# Patient Record
Sex: Male | Born: 1980 | Race: Black or African American | Hispanic: No | Marital: Single
Health system: Midwestern US, Community
[De-identification: ages and names within clinical notes are randomized; demographics above are authoritative.]

## PROBLEM LIST (undated history)

## (undated) DIAGNOSIS — J45909 Unspecified asthma, uncomplicated: Secondary | ICD-10-CM

## (undated) HISTORY — PX: CHOLECYSTECTOMY: SHX55

## (undated) MED ORDER — ONDANSETRON HCL 8 MG TAB: 8 mg | ORAL_TABLET | Freq: Three times a day (TID) | ORAL | Status: AC | PRN

## (undated) MED ORDER — HYDROCODONE-ACETAMINOPHEN 5 MG-325 MG TAB: 5-325 mg | ORAL_TABLET | ORAL | Status: AC | PRN

---

## 2000-03-15 ENCOUNTER — Emergency Department (HOSPITAL_COMMUNITY): Admission: EM | Admit: 2000-03-15 | Discharge: 2000-03-16 | Payer: Self-pay | Admitting: Emergency Medicine

## 2000-03-15 ENCOUNTER — Encounter: Payer: Self-pay | Admitting: Emergency Medicine

## 2000-11-28 HISTORY — PX: FINGER SURGERY: SHX640

## 2001-04-01 ENCOUNTER — Encounter: Payer: Self-pay | Admitting: Orthopedic Surgery

## 2001-04-01 ENCOUNTER — Inpatient Hospital Stay (HOSPITAL_COMMUNITY): Admission: EM | Admit: 2001-04-01 | Discharge: 2001-04-02 | Payer: Self-pay | Admitting: Emergency Medicine

## 2001-04-01 ENCOUNTER — Encounter: Payer: Self-pay | Admitting: Emergency Medicine

## 2002-10-13 ENCOUNTER — Emergency Department (HOSPITAL_COMMUNITY): Admission: EM | Admit: 2002-10-13 | Discharge: 2002-10-13 | Payer: Self-pay | Admitting: Emergency Medicine

## 2002-10-13 ENCOUNTER — Encounter: Payer: Self-pay | Admitting: Emergency Medicine

## 2005-11-27 ENCOUNTER — Emergency Department (HOSPITAL_COMMUNITY): Admission: EM | Admit: 2005-11-27 | Discharge: 2005-11-27 | Payer: Self-pay | Admitting: Family Medicine

## 2007-07-13 ENCOUNTER — Encounter: Admission: RE | Admit: 2007-07-13 | Discharge: 2007-07-13 | Payer: Self-pay | Admitting: Family Medicine

## 2008-08-18 IMAGING — CR DG FINGERS 2V UNILAT - NO REPORT
1 series · 3 of 3 positions shown · non-contrast
Comparison: NONE

CLINICAL DATA: Injured 01-03-07.  Pain and swelling at PIP joint 
of  index finger. 

RIGHT INDEX FINGER

[Series 1: view not recorded · 0.17mm/px · 3 of 3 slices shown]
[im 1/3]
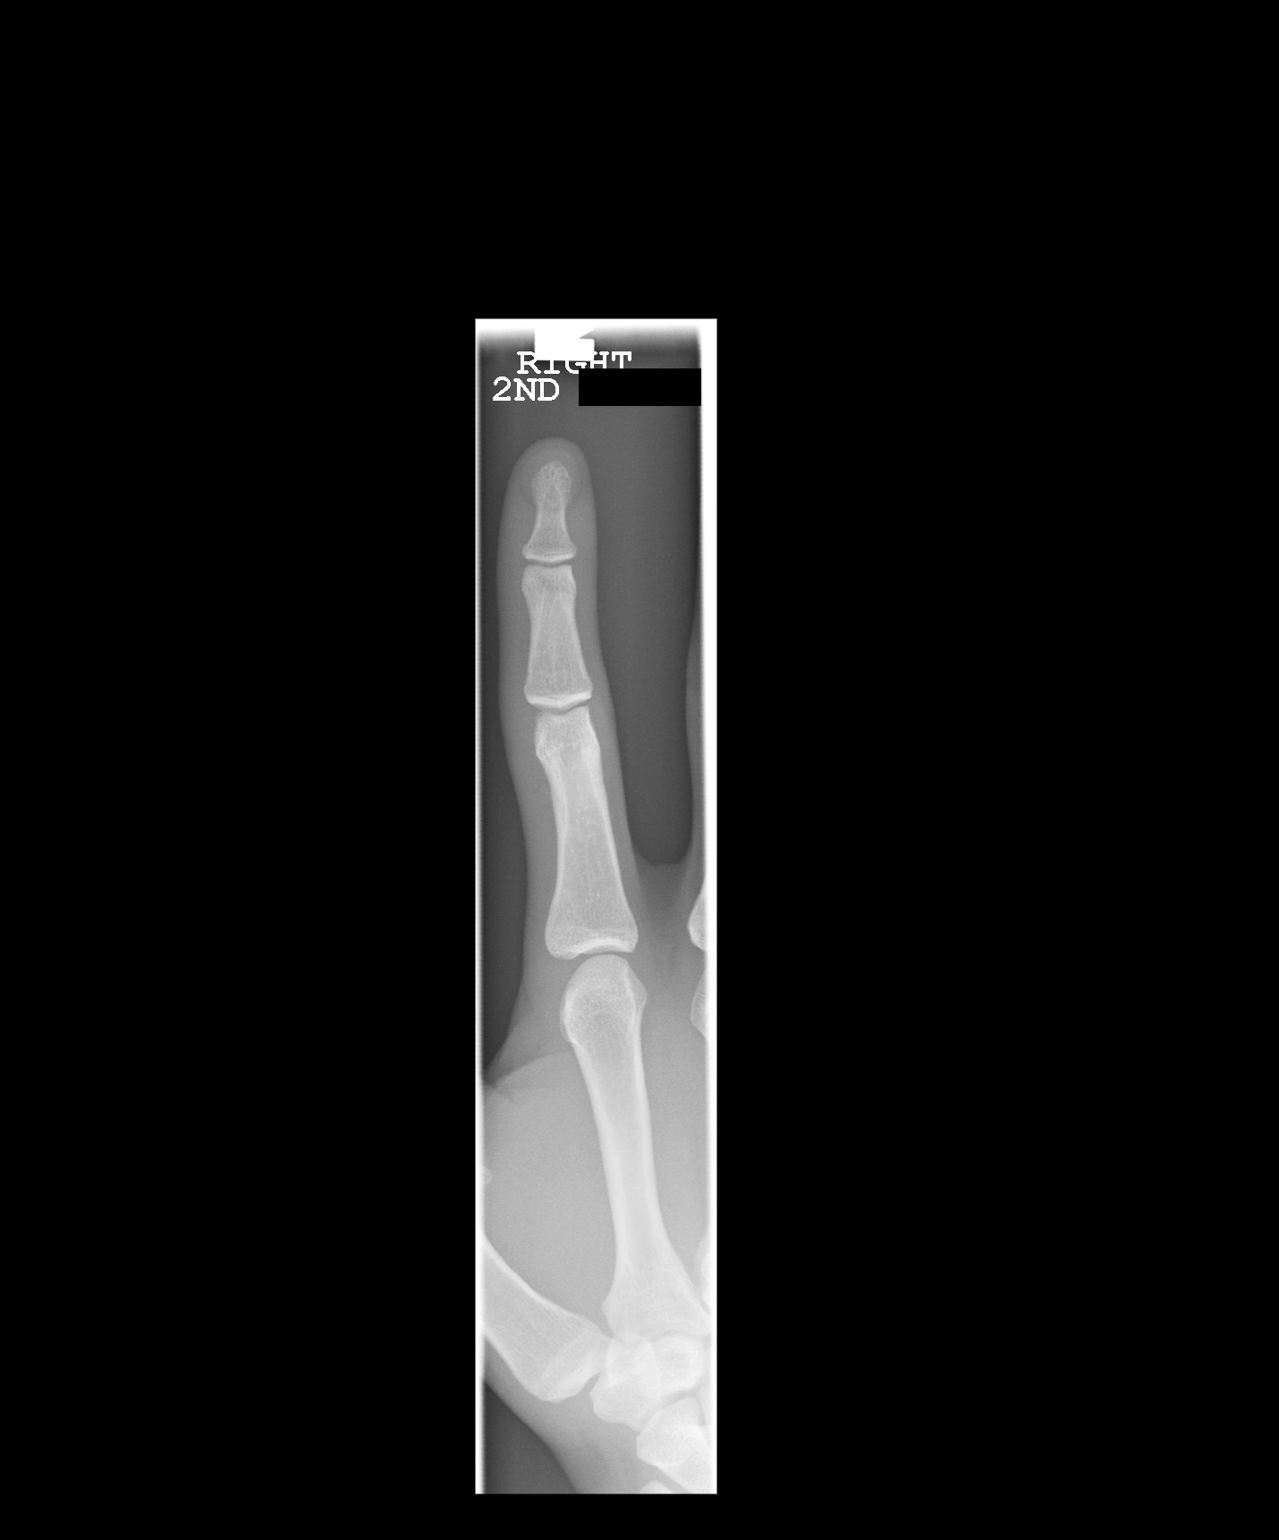
[im 2/3]
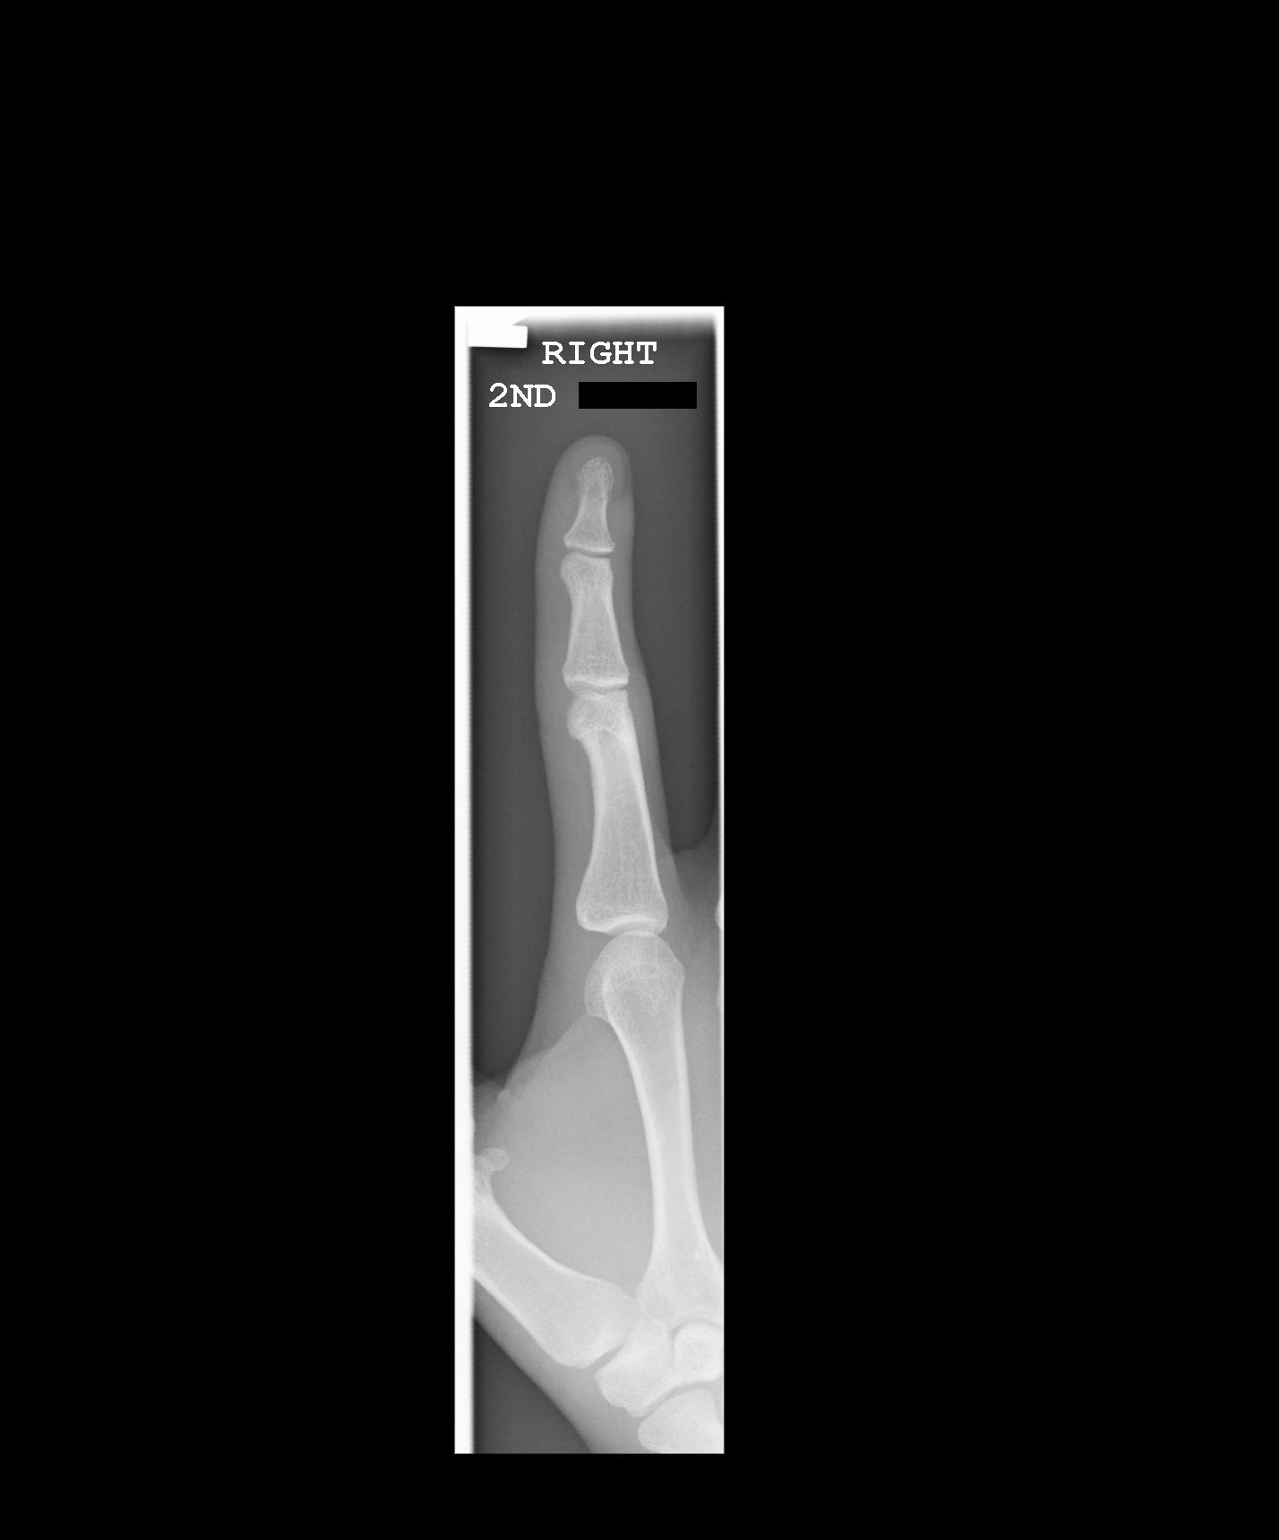
[im 3/3]
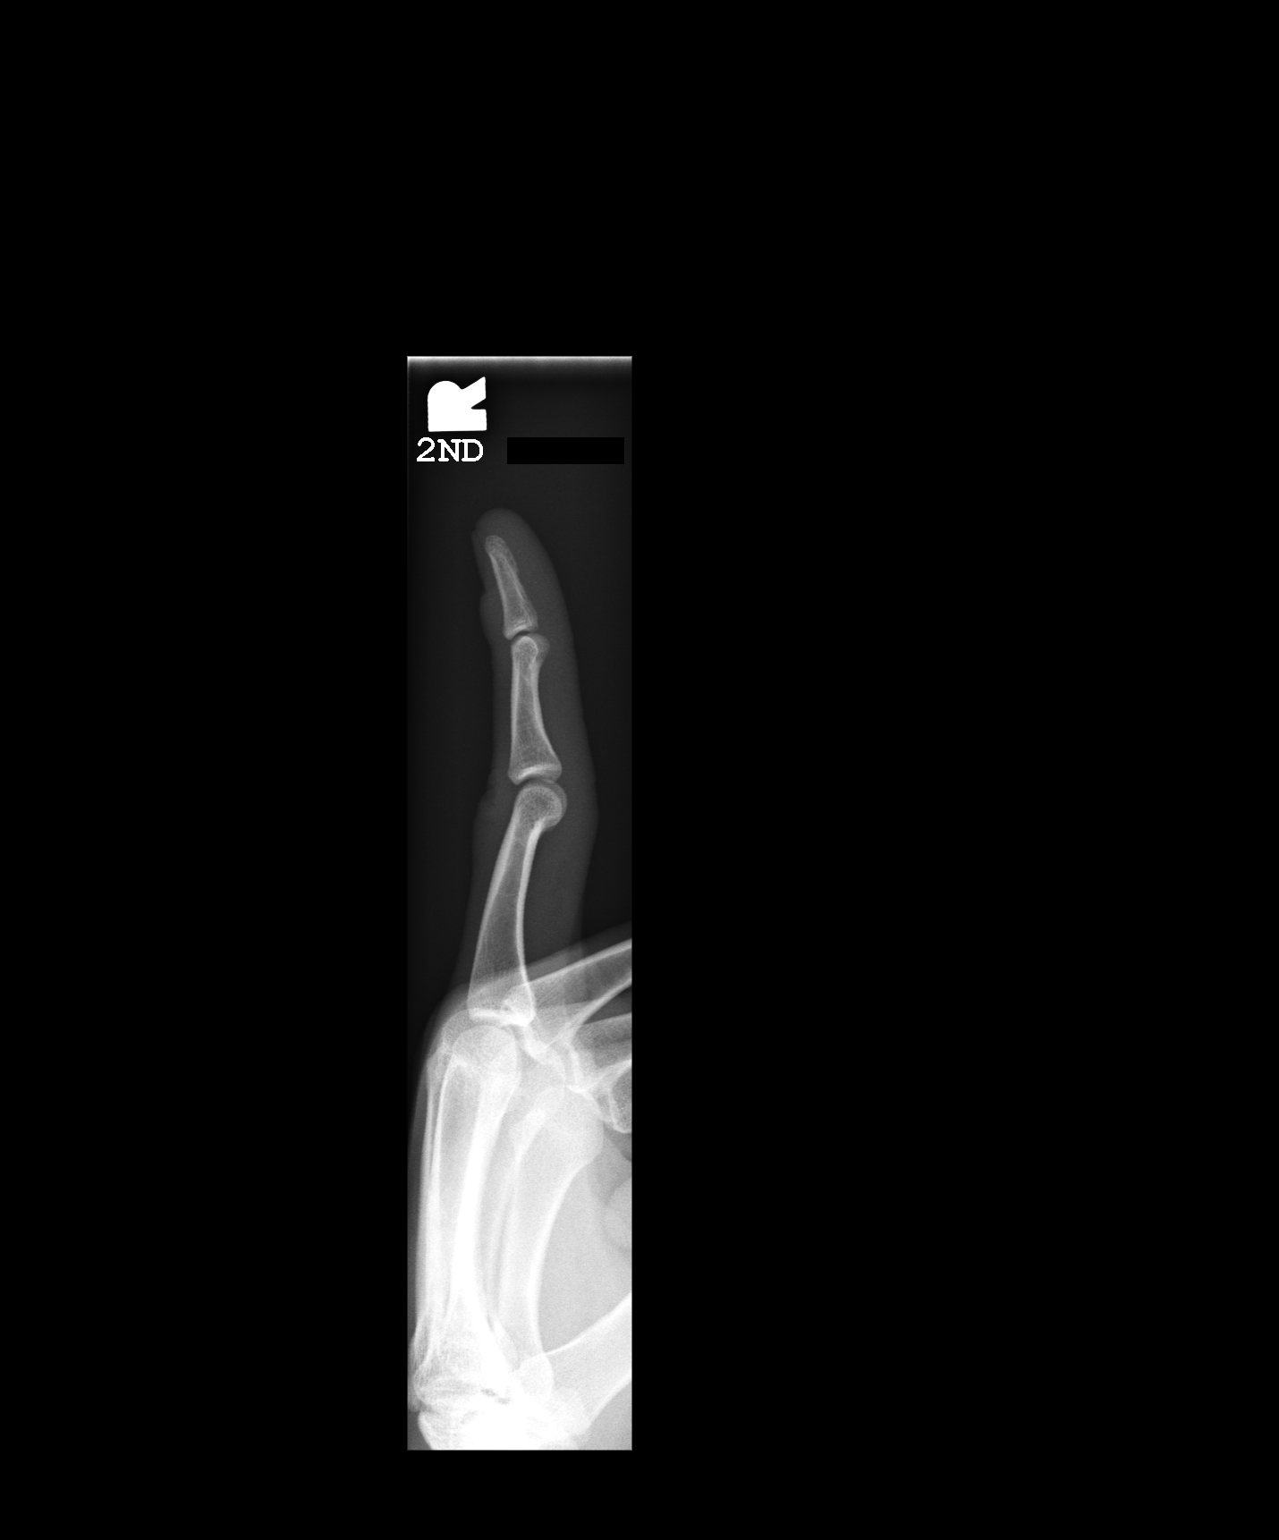

[3 of 3 positions shown; findings below may reference images not displayed]

FINDINGS: Views of the right finger demonstrate 
no evidence of fracture, dislocation, soft tissue abnormality or 
changes suggesting erosive or degenerative arthritis. 

electronically reviewed on 02/25/2007 Dict Date: 02/25/2007  Tran

## 2009-06-15 ENCOUNTER — Emergency Department (HOSPITAL_COMMUNITY): Admission: EM | Admit: 2009-06-15 | Discharge: 2009-06-15 | Payer: Self-pay | Admitting: Emergency Medicine

## 2011-04-15 NOTE — Op Note (Signed)
[facility]  Patient:    Robert Hall, Robert Hall                        MRN: [id number] Proc. Date: 04/01/01 Attending:  Aron Baba, M.D.                           Operative Report  PREOPERATIVE DIAGNOSES:  Open left small finger fracture with volar laceration 2.5 cm based volar ulnarly. Dorsal laceration as well with open hematoma of blood eggressing from this site. Comminuted nonarticular proximal phalanx fracture with severe displacement. Rule out neurovascular injury.  POSTOPERATIVE DIAGNOSES: 1. Extensor tendon laceration left small finger. 2. Open P1 (proximal phalanx fracture) left small finger. 3. Intact ulnar neurovascular bundles about the small finger and intact    flexor sheath with some hemorrhage noted in the sheath but no volar    opening.  OPERATION PERFORMED: 1. Incision and drainage skin and subcutaneous tissue bone and tendon sheath    tissue. This was excisional debridement with particulate matter secondary    to an open fracture. 2. Open reduction internal fixation open proximal phalanx fracture with    comminution. This was done with Kirschner wire fixation. 3. Extensor tendon repair, left small finger. 4. Exploration of the ulnar volar neurovascular bundles and flexor sheath    which revealed intact neurovascular bundles and intact sheath with some    blood in it from the dorsal wound. This does increase the risk for tendon    adhesions postoperatively. 5. Closure of a 2.5 - 3 cm volar ulnar laceration about the left small finger.  ANESTHESIA:  General.  ESTIMATED BLOOD LOSS:  Minimal.  TOURNIQUET TIME:  Less than 40 minutes.  SURGEON:  Dominica Severin, M.D.  ASSISTANT:  Shelbie Proctor, P.A.-C.  ANESTHESIA:  General.  DRAINS:  None.  INDICATIONS FOR PROCEDURE:  The patient is a 30 year old black male who sustained the above mentioned injury Apr 01, 2001 at approximately 7 p.m. and he was taken to [facility].  I was asked to see him by Dr. Carren Rang. Subsequent to this, I prepped the patient for surgery and discussed with him the risks and benefits of surgery including the risk of infection, bleeding, anesthesia, damage to normal structures and failure of the surgery to accomplish its intended goals of relieving symptoms and restoring function. With this in mind, he desires to proceed. I have discussed with him the risk of postoperative pain, stiffness, need for extensor and flexor tenolysis and other complications such as pin tract infection, etc. I discussed pre and postop plans with him at length and he desired to proceed.  OPERATIVE FINDINGS:  The patient had an open proximal phalanx fracture, this was opened through the dorsal laceration primarily with extensor tendon laceration which was repaired. Fixation was obtained with Eaton-Velski type technique pinning and this achieved excellent fixation. The bone ______ nicely and was stable. I do think he is a candidate for early range of motion. The patient underwent exploration of the ulnar, volar, and neurovascular bundles which were intact.  DESCRIPTION OF PROCEDURE:  The patient was seen by myself and anesthesia, taken to the operative suite. Preoperative antibiotics were given. His tetanus shot is up to date I should note. Once this was done, he was laid supine, smooth induction of general endotracheal anesthesia was induced. Following this, the patient underwent tourniquet application, appropriate padding, prepped and draped about the left  upper extremity in the usual sterile fashion with a 10 minute surgical Betadine scrub followed by Betadine solution, painting and draping. Once this was done, the arm was elevated, tourniquet was insufflated to 150 mmHg and the patient underwent an I&D of the polar ulnar structures and dorsal structures. This was an excisional debridement including skin, subcutaneous tissue and bone. The patient had  attention first placed about the ulnar volar laceration site. This underwent I&D and exploration. The skin, subcutaneous tissue and flexor sheath was I&Dd. The patient had the ulnar nerve and artery identified and these were noted to be intact. The patient had flexor sheath hemorrhaging of a mild amount but no obvious lack the volar flexor sheath. This indicated dorsal bleeding into the sheath in my opinion. This was not excessive. The neurovascular bundle and flexor sheath was explored at length. The radial neurovascular bundle was intact. This I&D was accomplished without difficulty and the wound was closed loosely with Prolene suture. Following this, dorsal I&D was accomplished with skin, subcutaneous tissue and bone. The extensor tendon was lacerated in a longitudinal and oblique fashion. The bone was easily identifiable in the wound, retractors were gently placed and an incision was made proximal to this. Removal of a 1 mm skin edge was accomplished with a scissor tip and knife blade. The fracture was identified, exposed and noted to be open through the dorsal wound. Thus I&D was accomplished with large amounts of antibiotic and normal saline. Following this, the patient had drapes changed and the patient then underwent reduction of the fracture. The fracture was reduced and held with provisional fixation followed by placement of Eaton-Velski type pinning with Kirschner of the 0.035 variety. This achieved excellent fixation and I was very pleased with this. This entered at the proximal phalanx, proximally at the MCP region without entering the MCP joint and then goes distally. This accomplished excellent fixation. I was very pleased with the fixation overall. With this noted, the patient had x-rays taken in AP, lateral and oblique which were saved for permanent documentation. Following taking these x-rays and saving them, the patient then underwent pin cap placement after the pins were  bent. The PIP and MCP had excellent range of motion and excellent bony stability was noted under fluoro and visually. Following this,  copious irrigation was applied and once again the tourniquet was deflated and the extensor tendon was repaired with 5-0 Mersilene suture in an interrupted fashion. Once this was repaired, the patient underwent a skin closure after hemostasis was secured 4-0 Prolene suture. Following this, the patient had Adaptic placed over the wound and Xeroform placed over the pins. A sterile dressing was placed as well as finger splint and volar plaster splint. The hand was placed in functional position with the IPs extended and the MCP joint flexed about the index through small fingers. The patient had excellent refill. Marcaine was placed in the wound for postop pain relief. Approximately 8 cc of 0.25% without epinephrine was placed. The patient tolerated the procedure well without difficulty. There were no immediate intraoperative complications. All sponge, needle and instrument counts were reported as correct. The patient will be admitted to the hospital for IV antibiotics, elevation, pain control, IV fluids and postop measures. I have discussed with the patients mother all findings. At the present time, the patient is stable in the recovery room. He was transferred there after being awakened from general endotracheal anesthesia. The operation went without difficulty. He did undergo extensive repair, bony repair, I&D, closure  of the volar laceration and exploration of structures as necessary as described within the context of this dictation. DD:  04/01/01 TD:  04/02/01 Job: [id number]

## 2011-11-01 ENCOUNTER — Emergency Department (HOSPITAL_BASED_OUTPATIENT_CLINIC_OR_DEPARTMENT_OTHER)
Admission: EM | Admit: 2011-11-01 | Discharge: 2011-11-01 | Disposition: A | Discharge status: Home / Self Care | Payer: 59 | Attending: Emergency Medicine | Admitting: Emergency Medicine

## 2011-11-01 ENCOUNTER — Emergency Department (INDEPENDENT_AMBULATORY_CARE_PROVIDER_SITE_OTHER): Payer: 59

## 2011-11-01 DIAGNOSIS — R109 Unspecified abdominal pain: Secondary | ICD-10-CM

## 2011-11-01 DIAGNOSIS — R110 Nausea: Secondary | ICD-10-CM | POA: Insufficient documentation

## 2011-11-01 DIAGNOSIS — M439 Deforming dorsopathy, unspecified: Secondary | ICD-10-CM

## 2011-11-01 LAB — URINALYSIS, ROUTINE W REFLEX MICROSCOPIC
Bilirubin Urine: NEGATIVE
Leukocytes, UA: NEGATIVE
Nitrite: NEGATIVE
Specific Gravity, Urine: 1.023 (ref 1.005–1.030)
Urobilinogen, UA: 0.2 mg/dL (ref 0.0–1.0)

## 2011-11-01 MED ORDER — SODIUM CHLORIDE 0.9 % IV BOLUS (SEPSIS)
1000.0000 mL | Freq: Once | INTRAVENOUS | Status: AC
  Administered 2011-11-01: 1000 mL via INTRAVENOUS

## 2011-11-01 MED ORDER — HYDROMORPHONE HCL PF 1 MG/ML IJ SOLN
1.0000 mg | Freq: Once | INTRAMUSCULAR | Status: AC
  Administered 2011-11-01: 1 mg via INTRAVENOUS
  Filled 2011-11-01: qty 1

## 2011-11-01 MED ORDER — ONDANSETRON HCL 4 MG PO TABS: 8.0000 mg | ORAL_TABLET | Freq: Four times a day (QID) | ORAL | Status: AC

## 2011-11-01 MED ORDER — HYDROCODONE-ACETAMINOPHEN 5-500 MG PO TABS: 1.0000 | ORAL_TABLET | Freq: Four times a day (QID) | ORAL | Status: AC | PRN

## 2011-11-01 MED ORDER — ONDANSETRON HCL 4 MG/2ML IJ SOLN
4.0000 mg | Freq: Once | INTRAMUSCULAR | Status: AC
  Administered 2011-11-01: 4 mg via INTRAVENOUS
  Filled 2011-11-01: qty 2

## 2011-11-01 NOTE — ED Notes (Signed)
Patient transported to CT 

## 2011-11-01 NOTE — ED Provider Notes (Signed)
History     CSN: [id number] Arrival date & time: 11/01/2011  9:33 AM   First MD Initiated Contact with Patient 11/01/11 [phone]      Chief Complaint  Patient presents with  . Abdominal Pain  . Nausea    (Consider location/radiation/quality/duration/timing/severity/associated sxs/prior treatment) Patient is a 30 y.o. male presenting with abdominal pain. The history is provided by the patient.  Abdominal Pain The primary symptoms of the illness include abdominal pain.   the patient reports developing acute onset sharp right sided abdominal pain with radiation up to his right flank.  He reported this as colicky in nature.  He's been nauseated.  He has not vomited.  He denies diarrhea.  Denies melena and hematochezia.  He has no prior history kidney stones.  He reports he awoke this morning and felt normal.  He denies fever and chills.  His symptoms are mild to moderate in severity.  There waxing and waning.  Nothing appears to improve his symptoms.  Nothing worsens the symptoms.  History reviewed. No pertinent past medical history.  History reviewed. No pertinent past surgical history.  No family history on file.  History  Substance Use Topics  . Smoking status: Never Smoker   . Smokeless tobacco: Never Used  . Alcohol Use: Yes     occasional      Review of Systems  Gastrointestinal: Positive for abdominal pain.  All other systems reviewed and are negative.    Allergies  Review of patient's allergies indicates no known allergies.  Home Medications  No current outpatient prescriptions on file.  BP 124/66  Pulse 57  Temp(Src) 98 F (36.7 C) (Oral)  Resp 16  Ht 5\' 11"  (1.803 m)  Wt 158 lb (71.668 kg)  BMI 22.04 kg/m2  SpO2 100%  Physical Exam  Nursing note and vitals reviewed. Constitutional: He is oriented to person, place, and time. He appears well-developed and well-nourished.  HENT:  Head: Normocephalic and atraumatic.  Eyes: EOM are normal.  Neck: Normal  range of motion.  Cardiovascular: Normal rate, regular rhythm, normal heart sounds and intact distal pulses.   Pulmonary/Chest: Effort normal and breath sounds normal. No respiratory distress.  Abdominal: Soft. He exhibits no distension and no mass. There is no rebound and no guarding.       Mild generalized right-sided abdominal tenderness without guarding or rebound  Genitourinary:       No CVA  Musculoskeletal: Normal range of motion.  Neurological: He is alert and oriented to person, place, and time.  Skin: Skin is warm and dry.  Psychiatric: He has a normal mood and affect. Judgment normal.    ED Course  Procedures (including critical care time)   Labs Reviewed  URINALYSIS, ROUTINE W REFLEX MICROSCOPIC   Ct Abdomen Pelvis Wo Contrast  11/01/2011  *RADIOLOGY REPORT*  Clinical Data: Right-sided flank pain.  Question ureteral stone.  CT ABDOMEN AND PELVIS WITHOUT CONTRAST  Technique:  Multidetector CT imaging of the abdomen and pelvis was performed following the standard protocol without intravenous contrast.  Comparison: Single view abdomen 07/13/2007.  Findings: Mild dependent atelectasis is present at the left lung base.  The lungs are otherwise clear without other focal nodule, mass, or airspace disease.  Heart size is normal.  No significant pleural or pericardial effusion is present.  The liver and spleen are within normal limits.  The stomach, duodenum, and pancreas are within normal limits.  The common bile duct and gallbladder are normal.  The adrenal glands are  normal bilaterally.  No significant nephrolithiasis is evident.  The ureters are within normal limits bilaterally to the level of the urinary bladder.  The rectosigmoid colon is within normal limits.  The remainder of the colon is unremarkable.  The appendix is visualized and normal. There are air-fluid levels within non dilated proximal loops of small bowel.  This is likely within normal limits.  A partial ileus is not  excluded.  There is no evidence for obstruction or free air. No significant adenopathy or free fluid is present.  The urinary bladder is within normal limits.  The bone windows demonstrate mild leftward curvature of the lumbar spine, centered at L2-3.  IMPRESSION:  1.  Mildly dilated loops of small bowel with air-fluid levels. This may be within normal limits.  A mild ileus is also considered 2.  No evidence for nephrolithiasis or hydronephrosis. 3.  Slight curvature of the lumbar spine.  Original Report Authenticated By: Jamesetta Orleans. MATTERN, M.D.   I personally reviewed the patient's CT scan.  1. Abdominal pain       MDM  Patient's presenting symptoms and complaints were concerning for right-sided ureterolithiasis.  CT scan is negative for ureterolithiasis.  There is comment of some small dilated loops of bowel which may reviewed possible ileus.  The patient feels much better at this time.  Repeat abdominal exam is benign.  My suspicion for early appendicitis is very low.  Patient was instructed to return back to the ER for severe nausea vomiting or development of fever greater than 101 or worsening abdominal pain.  The patient understands this and all of his questions were answered        Lyanne Co, MD 11/01/11 1217

## 2011-11-01 NOTE — ED Notes (Signed)
Pt reports he developed sharp abdominal pain and nausea this am.

## 2013-04-13 LAB — CBC WITH AUTOMATED DIFF
ABS. BASOPHILS: 0 10*3/uL (ref 0.0–0.2)
ABS. EOSINOPHILS: 0 10*3/uL (ref 0.0–0.8)
ABS. IMM. GRANS.: 0 10*3/uL (ref 0.0–0.5)
ABS. LYMPHOCYTES: 1 10*3/uL (ref 0.5–4.6)
ABS. MONOCYTES: 0.8 10*3/uL (ref 0.1–1.3)
ABS. NEUTROPHILS: 9.1 10*3/uL — ABNORMAL HIGH (ref 1.7–8.2)
BASOPHILS: 0 % (ref 0.0–2.0)
EOSINOPHILS: 0 % — ABNORMAL LOW (ref 0.5–7.8)
HCT: 45.1 % (ref 41.1–50.3)
HGB: 15.7 g/dL (ref 13.6–17.2)
IMMATURE GRANULOCYTES: 0.3 % (ref 0.0–5.0)
LYMPHOCYTES: 9 % — ABNORMAL LOW (ref 13–44)
MCH: 27.7 PG (ref 26.1–32.9)
MCHC: 34.8 g/dL (ref 31.4–35.0)
MCV: 79.7 FL (ref 79.6–97.8)
MONOCYTES: 7 % (ref 4.0–12.0)
MPV: 10.7 FL — ABNORMAL LOW (ref 10.8–14.1)
NEUTROPHILS: 84 % — ABNORMAL HIGH (ref 43–78)
PLATELET: 190 10*3/uL (ref 150–450)
RBC: 5.66 M/uL (ref 4.23–5.67)
RDW: 13.7 % (ref 11.9–14.6)
WBC: 11 10*3/uL (ref 4.3–11.1)

## 2013-04-13 LAB — METABOLIC PANEL, COMPREHENSIVE
A-G Ratio: 1 — ABNORMAL LOW (ref 1.2–3.5)
ALT (SGPT): 34 U/L (ref 12–65)
AST (SGOT): 28 U/L (ref 15–37)
Albumin: 4.2 g/dL (ref 3.5–5.0)
Alk. phosphatase: 79 U/L (ref 50–136)
Anion gap: 8 mmol/L (ref 7–16)
BUN: 12 MG/DL (ref 6–23)
Bilirubin, total: 1.1 MG/DL (ref 0.2–1.1)
CO2: 30 mmol/L (ref 21–32)
Calcium: 9.7 MG/DL (ref 8.3–10.4)
Chloride: 105 mmol/L (ref 98–107)
Creatinine: 1 MG/DL (ref 0.8–1.5)
GFR est AA: >60 mL/min/{1.73_m2} (ref >60)
GFR est non-AA: >60 mL/min/{1.73_m2} (ref >60)
Globulin: 4.3 g/dL — ABNORMAL HIGH (ref 2.3–3.5)
Glucose: 89 mg/dL (ref 65–100)
Potassium: 3.9 mmol/L (ref 3.5–5.1)
Protein, total: 8.5 g/dL — ABNORMAL HIGH (ref 6.3–8.2)
Sodium: 143 mmol/L (ref 136–145)

## 2013-04-13 MED ADMIN — sodium chloride 0.9 % bolus infusion 1,000 mL: INTRAVENOUS | @ 19:00:00 | NDC 00409798309

## 2013-04-13 MED ADMIN — ketorolac (TORADOL) injection 30 mg: INTRAVENOUS | @ 19:00:00 | NDC 00409379501

## 2013-04-13 MED ADMIN — ondansetron (ZOFRAN) injection 4 mg: INTRAVENOUS | @ 19:00:00 | NDC 00409475503

## 2013-04-13 MED FILL — KETOROLAC TROMETHAMINE 30 MG/ML INJECTION: 30 mg/mL (1 mL) | INTRAMUSCULAR | Qty: 1

## 2013-04-13 MED FILL — ONDANSETRON (PF) 4 MG/2 ML INJECTION: 4 mg/2 mL | INTRAMUSCULAR | Qty: 2

## 2013-04-13 NOTE — ED Notes (Signed)
Patient in CT.

## 2013-04-13 NOTE — ED Notes (Signed)
I have reviewed discharge instructions with the patient and spouse.  The patient and spouse verbalized understanding.  Patient signed hard copy discharge instructions.

## 2013-04-13 NOTE — ED Notes (Signed)
Patient given Ginger Ale to sip per request.

## 2013-04-13 NOTE — ED Notes (Signed)
Patient reports feeling "much better".  IV fluids completed.  Pain decreased 3/10.   Patient tolerating po fluids without nausea or vomiting.

## 2013-04-13 NOTE — ED Notes (Signed)
Patient states he started having left flank and abdominal pain last night, states he then became dizzy and started vomiting.

## 2013-04-13 NOTE — ED Provider Notes (Signed)
Patient is a 32 y.o. male presenting with vomiting. The history is provided by the patient.   Vomiting   This is a new problem. The current episode started 12 to 24 hours ago. The problem has been gradually worsening. The emesis has an appearance of stomach contents. There has been no fever. Associated symptoms include abdominal pain. Pertinent negatives include no chills, no fever, no sweats, no diarrhea, no arthralgias, no myalgias and no cough. His pertinent negatives include no recent abdominal surgery and no DM.        History reviewed. No pertinent past medical history.     History reviewed. No pertinent past surgical history.      History reviewed. No pertinent family history.     History     Social History   ??? Marital Status: SINGLE     Spouse Name: N/A     Number of Children: N/A   ??? Years of Education: N/A     Occupational History   ??? Not on file.     Social History Main Topics   ??? Smoking status: Never Smoker    ??? Smokeless tobacco: Never Used   ??? Alcohol Use: No   ??? Drug Use: No   ??? Sexually Active: Not on file     Other Topics Concern   ??? Not on file     Social History Narrative   ??? No narrative on file                  ALLERGIES: Review of patient's allergies indicates no known allergies.      Review of Systems   Constitutional: Negative.  Negative for fever, chills and activity change.   HENT: Negative.    Eyes: Negative.    Respiratory: Negative.  Negative for cough.    Cardiovascular: Negative.    Gastrointestinal: Positive for vomiting and abdominal pain. Negative for diarrhea.   Genitourinary: Negative.    Musculoskeletal: Negative.  Negative for myalgias and arthralgias.   Skin: Negative.    Neurological: Negative.    Psychiatric/Behavioral: Negative.    All other systems reviewed and are negative.        Filed Vitals:    04/13/13 1348   BP: 144/93   Pulse: 64   Temp: 98 ??F (36.7 ??C)   Resp: 19   Height: 5\' 6"  (1.676 m)   Weight: 81.647 kg (180 lb)   SpO2: 99%            Physical Exam   Nursing  note and vitals reviewed.  Constitutional: He is oriented to person, place, and time. He appears well-developed and well-nourished.   HENT:   Head: Normocephalic and atraumatic.   Right Ear: External ear normal.   Left Ear: External ear normal.   Eyes: Conjunctivae and EOM are normal. Pupils are equal, round, and reactive to light.   Neck: Normal range of motion. Neck supple.   Cardiovascular: Normal rate, regular rhythm and intact distal pulses.    Pulmonary/Chest: Effort normal and breath sounds normal.   Abdominal: Soft. Bowel sounds are normal. He exhibits no distension. There is no tenderness. There is no rebound and no guarding.   Musculoskeletal: Normal range of motion.   Neurological: He is alert and oriented to person, place, and time. No cranial nerve deficit.   Skin: Skin is warm and dry.   Psychiatric: He has a normal mood and affect.        MDM     Amount  and/or Complexity of Data Reviewed:   Clinical lab tests:  Ordered and reviewed  Tests in the radiology section of CPT??:  Ordered and reviewed  Tests in the medicine section of the CPT??:  Ordered and reviewed   Independant visualization of image, tracing, or specimen:  Yes  Risk of Significant Complications, Morbidity, and/or Mortality:   Presenting problems:  Moderate  Diagnostic procedures:  Moderate  Management options:  Moderate  Progress:   Patient progress:  Stable      Procedures

## 2013-04-26 IMAGING — CT CT ABD-PELV W/O CM
2 of 3 series · 16 of 46 positions shown, 18 images · non-contrast
Comparison: Single view abdomen 07/13/2007.

CLINICAL DATA: Right-sided flank pain.  Question ureteral stone.

CT ABDOMEN AND PELVIS WITHOUT CONTRAST
TECHNIQUE: Multidetector CT imaging of the abdomen and pelvis was
performed following the standard protocol without intravenous
contrast.

[Series 2: renal stone < 200 lbs 5.0 b31f · axial · 0.67mm/px · z∈[-442,-92]mm · 13 of 82 slices shown, 15 images]
[im 6/82  soft-tissue]
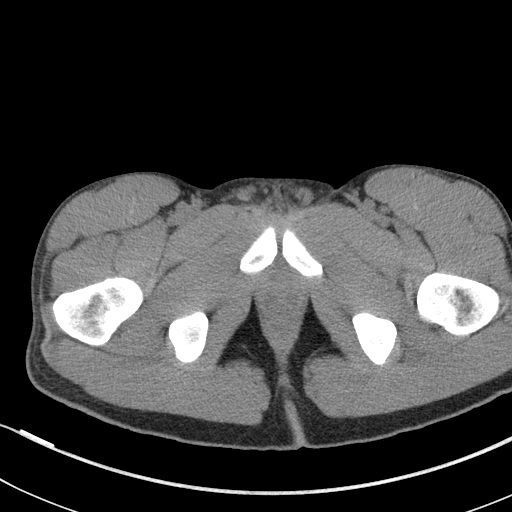
[im 6/82  bone]
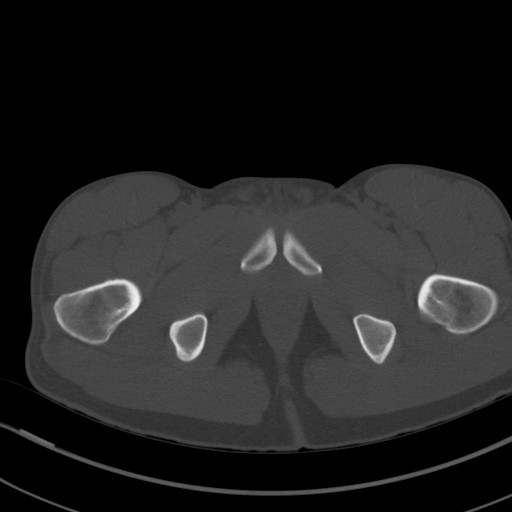
[im 11/82  soft-tissue]
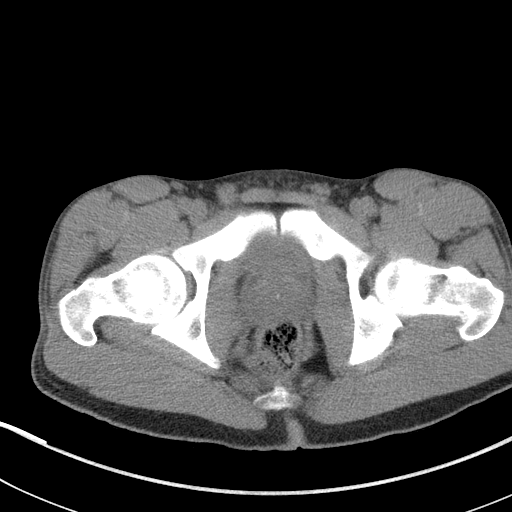
[im 16/82  soft-tissue]
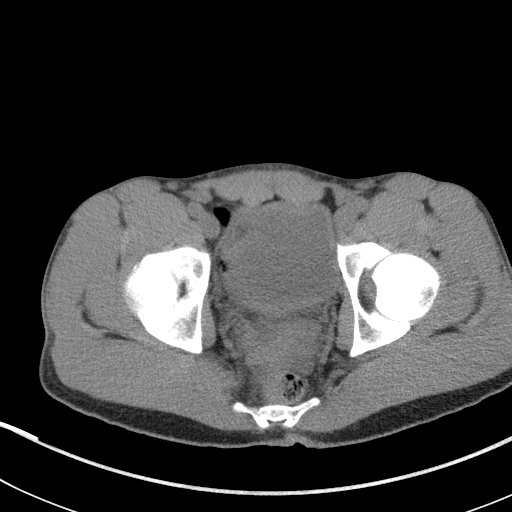
[im 24/82  soft-tissue]
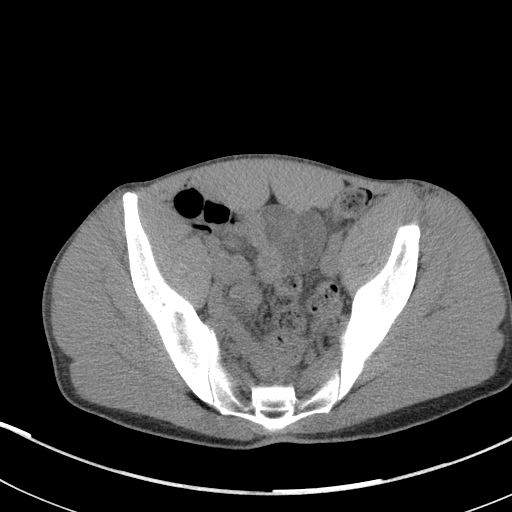
[im 29/82  soft-tissue]
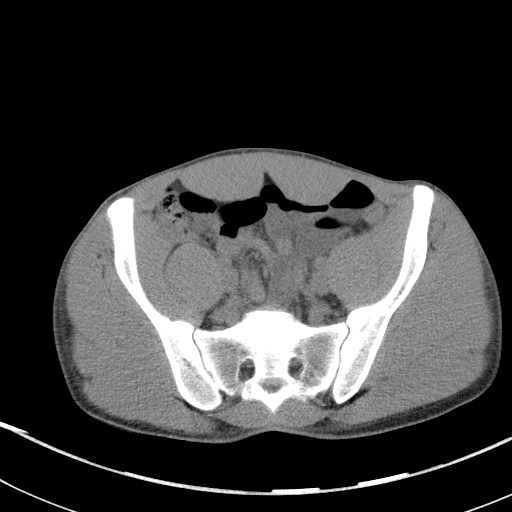
[im 34/82  soft-tissue]
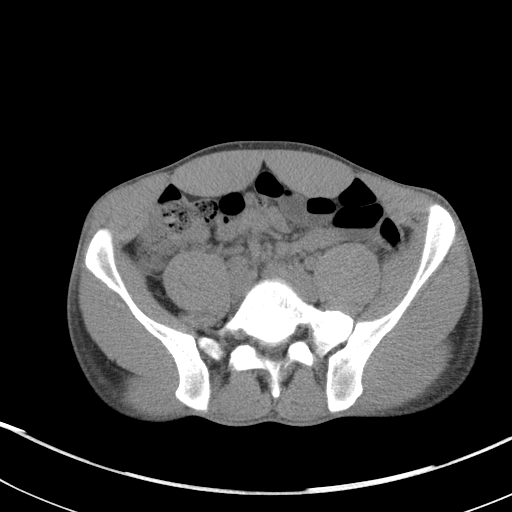
[im 42/82  soft-tissue]
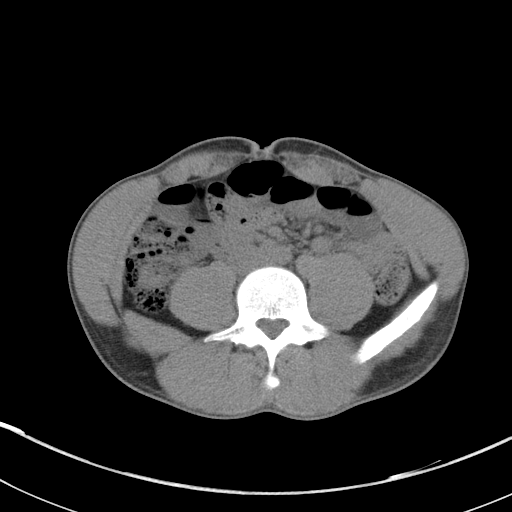
[im 48/82  soft-tissue]
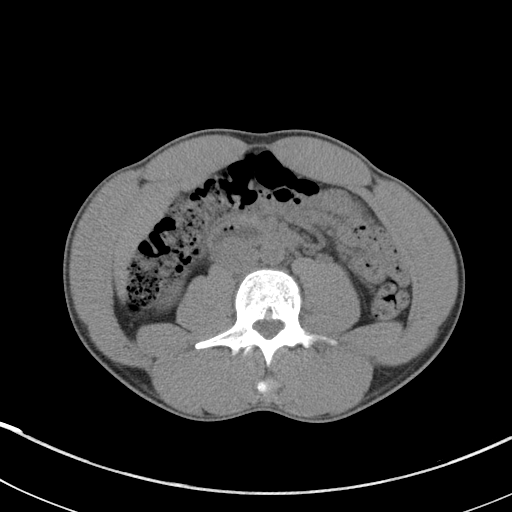
[im 53/82  soft-tissue]
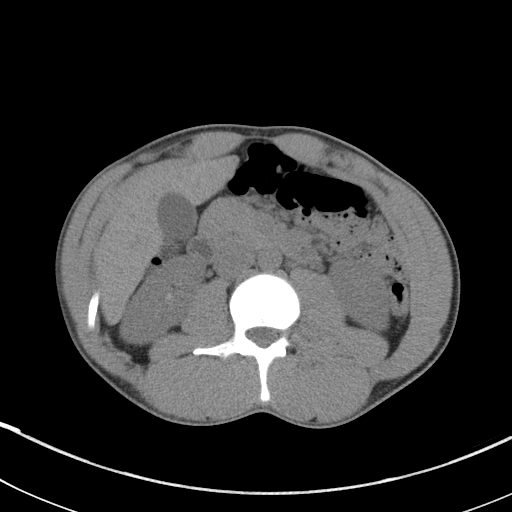
[im 53/82  bone]
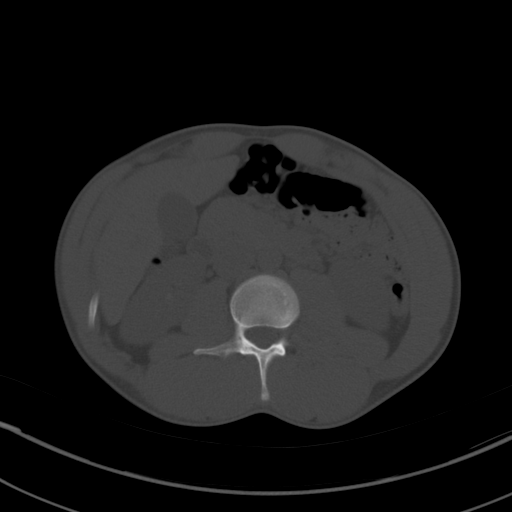
[im 58/82  soft-tissue]
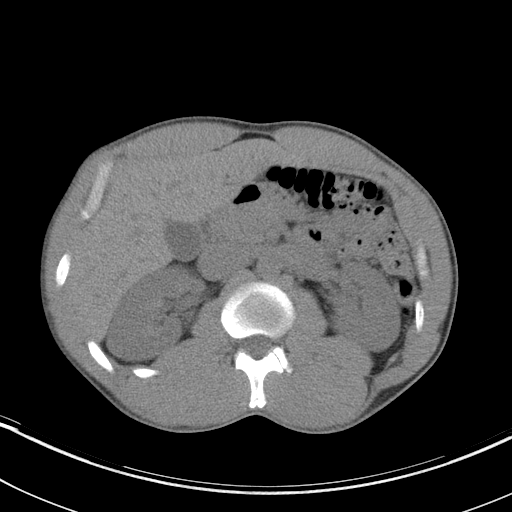
[im 66/82  soft-tissue]
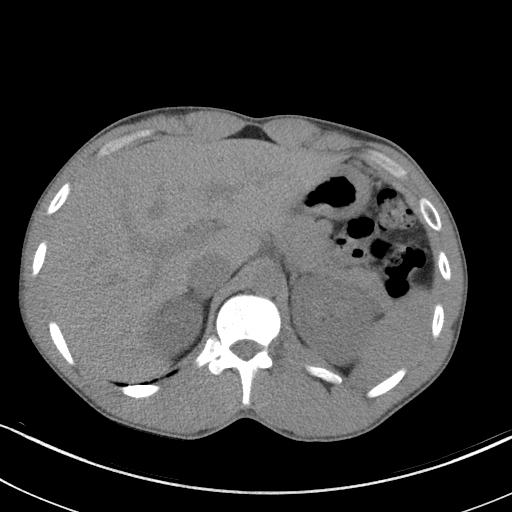
[im 71/82  soft-tissue]
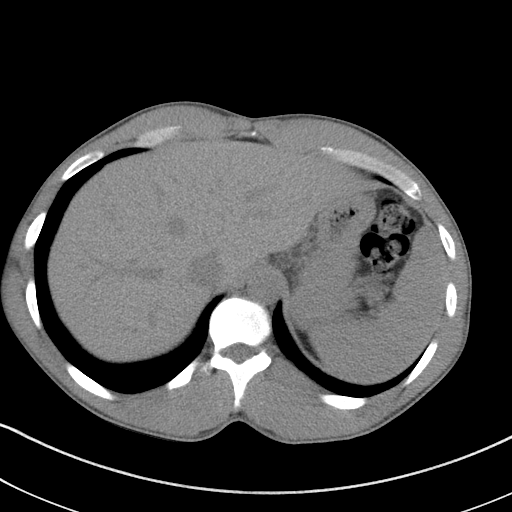
[im 76/82  soft-tissue]
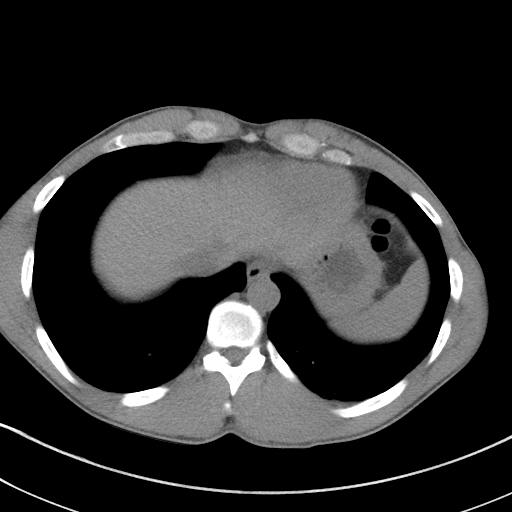

[Series 5: renal stone 3.0 coronal · coronal · 0.70mm/px · 3 of 75 slices shown]
[im 25/75  soft-tissue]
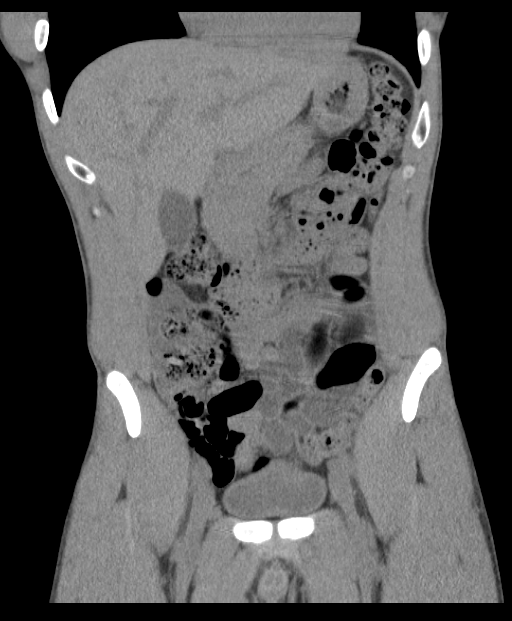
[im 33/75  soft-tissue]
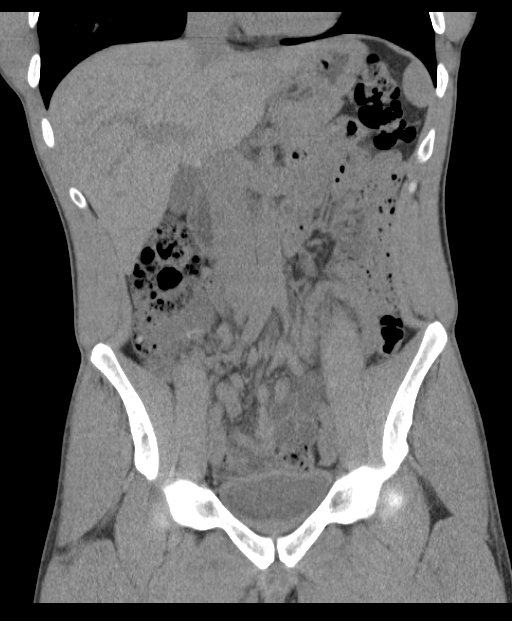
[im 42/75  soft-tissue]
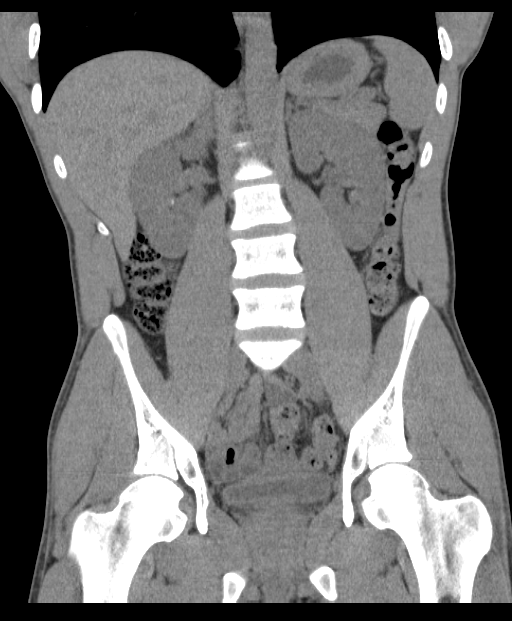

[16 of 46 positions shown; findings below may reference images not displayed]

FINDINGS: Mild dependent atelectasis is present at the left lung
base.  The lungs are otherwise clear without other focal nodule,
mass, or airspace disease.  Heart size is normal.  No significant
pleural or pericardial effusion is present.

The liver and spleen are within normal limits.

The stomach, duodenum, and pancreas are within normal limits.  The
common bile duct and gallbladder are normal.

The adrenal glands are normal bilaterally.  No significant
nephrolithiasis is evident.  The ureters are within normal limits
bilaterally to the level of the urinary bladder.

The rectosigmoid colon is within normal limits.  The remainder of
the colon is unremarkable.  The appendix is visualized and normal.
There are air-fluid levels within non dilated proximal loops of
small bowel.  This is likely within normal limits.  A partial ileus
is not excluded.  There is no evidence for obstruction or free air.
No significant adenopathy or free fluid is present.

The urinary bladder is within normal limits.

The bone windows demonstrate mild leftward curvature of the lumbar
spine, centered at L2-3.
IMPRESSION: 1.  Mildly dilated loops of small bowel with air-fluid levels.
This may be within normal limits.  A mild ileus is also considered
2.  No evidence for nephrolithiasis or hydronephrosis.
3.  Slight curvature of the lumbar spine.

## 2015-07-22 ENCOUNTER — Ambulatory Visit (INDEPENDENT_AMBULATORY_CARE_PROVIDER_SITE_OTHER): Payer: 59 | Admitting: Psychology

## 2015-07-22 DIAGNOSIS — F411 Generalized anxiety disorder: Secondary | ICD-10-CM

## 2015-08-07 ENCOUNTER — Ambulatory Visit (INDEPENDENT_AMBULATORY_CARE_PROVIDER_SITE_OTHER): Payer: 59 | Admitting: Psychology

## 2015-08-07 DIAGNOSIS — F411 Generalized anxiety disorder: Secondary | ICD-10-CM

## 2015-08-21 ENCOUNTER — Ambulatory Visit (INDEPENDENT_AMBULATORY_CARE_PROVIDER_SITE_OTHER): Payer: 59 | Admitting: Psychology

## 2015-08-21 DIAGNOSIS — F411 Generalized anxiety disorder: Secondary | ICD-10-CM | POA: Diagnosis not present

## 2015-09-02 ENCOUNTER — Ambulatory Visit: Payer: 59 | Admitting: Psychology

## 2015-09-10 ENCOUNTER — Ambulatory Visit: Payer: 59 | Admitting: Psychology

## 2016-05-07 ENCOUNTER — Emergency Department (HOSPITAL_BASED_OUTPATIENT_CLINIC_OR_DEPARTMENT_OTHER)
Admission: EM | Admit: 2016-05-07 | Discharge: 2016-05-07 | Disposition: A | Discharge status: Home / Self Care | Payer: Commercial Managed Care - HMO | Attending: Emergency Medicine | Admitting: Emergency Medicine

## 2016-05-07 ENCOUNTER — Encounter (HOSPITAL_BASED_OUTPATIENT_CLINIC_OR_DEPARTMENT_OTHER): Payer: Self-pay | Admitting: *Deleted

## 2016-05-07 DIAGNOSIS — F17220 Nicotine dependence, chewing tobacco, uncomplicated: Secondary | ICD-10-CM | POA: Diagnosis not present

## 2016-05-07 DIAGNOSIS — J45909 Unspecified asthma, uncomplicated: Secondary | ICD-10-CM | POA: Diagnosis not present

## 2016-05-07 DIAGNOSIS — R51 Headache: Secondary | ICD-10-CM | POA: Diagnosis not present

## 2016-05-07 DIAGNOSIS — F40298 Other specified phobia: Secondary | ICD-10-CM | POA: Diagnosis not present

## 2016-05-07 DIAGNOSIS — H53149 Visual discomfort, unspecified: Secondary | ICD-10-CM | POA: Insufficient documentation

## 2016-05-07 DIAGNOSIS — R519 Headache, unspecified: Secondary | ICD-10-CM

## 2016-05-07 DIAGNOSIS — R110 Nausea: Secondary | ICD-10-CM | POA: Diagnosis not present

## 2016-05-07 HISTORY — DX: Unspecified asthma, uncomplicated: J45.909

## 2016-05-07 MED ORDER — MAGNESIUM SULFATE 2 GM/50ML IV SOLN
2.0000 g | Freq: Once | INTRAVENOUS | Status: AC
  Administered 2016-05-07: 2 g via INTRAVENOUS
  Filled 2016-05-07: qty 50

## 2016-05-07 MED ORDER — KETOROLAC TROMETHAMINE 30 MG/ML IJ SOLN
30.0000 mg | Freq: Once | INTRAMUSCULAR | Status: AC
  Administered 2016-05-07: 30 mg via INTRAVENOUS
  Filled 2016-05-07: qty 1

## 2016-05-07 MED ORDER — SODIUM CHLORIDE 0.9 % IV BOLUS (SEPSIS)
1000.0000 mL | Freq: Once | INTRAVENOUS | Status: AC
  Administered 2016-05-07: 1000 mL via INTRAVENOUS

## 2016-05-07 MED ORDER — DEXAMETHASONE SODIUM PHOSPHATE 10 MG/ML IJ SOLN
10.0000 mg | Freq: Once | INTRAMUSCULAR | Status: AC
  Administered 2016-05-07: 10 mg via INTRAVENOUS
  Filled 2016-05-07: qty 1

## 2016-05-07 MED ORDER — PROCHLORPERAZINE EDISYLATE 5 MG/ML IJ SOLN
10.0000 mg | Freq: Once | INTRAMUSCULAR | Status: AC
  Administered 2016-05-07: 10 mg via INTRAVENOUS
  Filled 2016-05-07: qty 2

## 2016-05-07 MED ORDER — DIPHENHYDRAMINE HCL 50 MG/ML IJ SOLN
50.0000 mg | Freq: Once | INTRAMUSCULAR | Status: AC
  Administered 2016-05-07: 50 mg via INTRAVENOUS
  Filled 2016-05-07: qty 1

## 2016-05-07 NOTE — ED Notes (Signed)
Pt reports he's had a headache since Monday; saw PCP on Thursday and received prescription for Butalb-Acetaminophen with caffeine. Reports med ineffective, along with Exedrin, Motrin, other OTC meds. Also reports nausea and sensitivity to light and sound, blurry vision, lightheadedness; denies v/d, fever. 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 Reports remote hx of migraines as a child.

## 2016-05-07 NOTE — ED Provider Notes (Signed)
CSN: [id number]     Arrival date & time 05/07/16  [id number] History   First MD Initiated Contact with Patient 05/07/16 [phone]     Chief Complaint  Patient presents with  . Headache     (Consider location/radiation/quality/duration/timing/severity/associated sxs/prior Treatment) HPI [id number] year old male who presents with headache. States onset of headache that has been gradually worsening since 5 days ago. Pain occurred at rest and not associated with trauma. States headache bilateral over the top of his head and goes to the forehead. Throbbing in nature. Associated with nausea but denies any vomiting. Associated with photophobia and phonophobia. No fevers, cough, or upper respiratory infection recently. No GI symptoms. No focal numbness or weakness. States headache typically worse at the end of the day, but has been constant and persistent. Works as a Emergency planning/management officerpolice officer and usually gets off work at [employer] AM and has 4 children including her 375-month-old twins that he also has been taking care of, thus has not been sleeping as normal. Has had history of childhood migraines up until the age of 510[age]36 years old. Has had occasional headaches off and on but nothing this long lasting or severe. Has been taking initially Excedrin migraine earlier this week, with addition of Motrin intermittently. Was seen by [facility] and prescribed Fioricet which also was not helping his pain yesterday. Past Medical History  Diagnosis Date  . Asthma     as a child   Past Surgical History  Procedure Laterality Date  . Cholecystectomy    . Finger surgery Left 2002    5th digit   History reviewed. No pertinent family history. Social History  Substance Use Topics  . Smoking status: Never Smoker   . Smokeless tobacco: Current User    Types: Chew  . Alcohol Use: Yes     Comment: occasional    Review of Systems 10/14 systems reviewed and are negative other than those stated in the HPI    Allergies  Review of patient's  allergies indicates no known allergies.  Home Medications   Prior to Admission medications   Not on File   BP 97/53 mmHg  Pulse 58  Temp(Src) 98.6 F (37 C) (Oral)  Resp 16  Ht 5\' 10"  (1.778 m)  Wt 164 lb (74.39 kg)  BMI 23.53 kg/m2  SpO2 100% Physical Exam Physical Exam  Nursing note and vitals reviewed. Constitutional: Well developed, well nourished, non-toxic, and in no acute distress Head: Normocephalic and atraumatic.  Mouth/Throat: Oropharynx is clear and moist.  Neck: Normal range of motion. Neck supple. No mengingismus Cardiovascular: Normal rate and regular rhythm.   Pulmonary/Chest: Effort normal and breath sounds normal.  Abdominal: Soft. There is no tenderness. There is no rebound and no guarding.  Musculoskeletal: Normal range of motion.  Skin: Skin is warm and dry.  Psychiatric: Cooperative Neurological:  Alert, oriented to person, place, time, and situation. Memory grossly in tact. Fluent speech. No dysarthria or aphasia.  Cranial nerves:Pupils are symmetric, and reactive to light. EOMI without nystagmus. No gaze deviation. Facial muscles symmetric with activation. Sensation to light touch over face in tact bilaterally. Hearing grossly in tact. Palate elevates symmetrically. Head turn and shoulder shrug are intact. Tongue midline.  Reflexes defered.  Muscle bulk and tone normal. No pronator drift. Moves all extremities symmetrically. Sensation to light touch is in tact throughout in bilateral upper and lower extremities. Coordination reveals no dysmetria with finger to nose. Gait is narrow-based and steady. Non-ataxic.   ED Course  Procedures (including  critical care time) Labs Review Labs Reviewed - No data to display  Imaging Review No results found. I have personally reviewed and evaluated these images and lab results as part of my medical decision-making.   EKG Interpretation None      MDM   Final diagnoses:  Acute nonintractable headache,  unspecified headache type   History of childhood migraines with new headache ongoing for 5 days. Well-appearing in no acute distress with normal vital signs. He has a normal neurological exam. No meningismus or infectious symptoms to suggest meningitis. Headache not of sudden onset maximal intensity, and not concerning for subarachnoid hemorrhage. No concern for other intracranial bleeding, mass, or other serious intracranial process at this time. We'll give migraine cocktail including IV fluids, Toradol, Compazine and Benadryl. Pain improved from 8 out of 10 to 6 out of 10 after migraine cocktail. Also received Decadron and magnesium sulfate subsequently, and pain now improving to 4 out 10 in severity. He states that headache is improved enough that he can continue to manage from home. He will continue home analgesics as needed. Discussed strict return instructions that would require reevaluation and potential imaging. He expressed understanding of all discharge instructions and felt comfortable with the plan of care.    Lavera Guise, MD 05/07/16 1051

## 2016-05-07 NOTE — ED Notes (Signed)
MD at bedside. 

## 2016-05-07 NOTE — Discharge Instructions (Signed)
Continue to take tylenol and motrin as needed for headache, but avoid taking these medications around the clock for more than 4 days in a row. Return for worsening symptoms, including worsening pain, vomiting unable to keep down food or fluids, fever, new vision or speech changes, difficulty walking, or persistent headache after more than 1-2 weeks,   or any other symptoms concerning to you   General Headache Without Cause A headache is pain or discomfort felt around the head or neck area. There are many causes and types of headaches. In some cases, the cause may not be found.  HOME CARE  Managing Pain  Take over-the-counter and prescription medicines only as told by your doctor.  Lie down in a dark, quiet room when you have a headache.  If directed, apply ice to the head and neck area:  Put ice in a plastic bag.  Place a towel between your skin and the bag.  Leave the ice on for 20 minutes, 2-3 times per day.  Use a heating pad or hot shower to apply heat to the head and neck area as told by your doctor.  Keep lights dim if bright lights bother you or make your headaches worse. Eating and Drinking  Eat meals on a regular schedule.  Lessen how much alcohol you drink.  Lessen how much caffeine you drink, or stop drinking caffeine. General Instructions  Keep all follow-up visits as told by your doctor. This is important.  Keep a journal to find out if certain things bring on headaches. For example, write down:  What you eat and drink.  How much sleep you get.  Any change to your diet or medicines.  Relax by getting a massage or doing other relaxing activities.  Lessen stress.  Sit up straight. Do not tighten (tense) your muscles.  Do not use tobacco products. This includes cigarettes, chewing tobacco, or e-cigarettes. If you need help quitting, ask your doctor.  Exercise regularly as told by your doctor.  Get enough sleep. This often means 7-9 hours of sleep. GET  HELP IF:  Your symptoms are not helped by medicine.  You have a headache that feels different than the other headaches.  You feel sick to your stomach (nauseous) or you throw up (vomit).  You have a fever. GET HELP RIGHT AWAY IF:   Your headache becomes really bad.  You keep throwing up.  You have a stiff neck.  You have trouble seeing.  You have trouble speaking.  You have pain in the eye or ear.  Your muscles are weak or you lose muscle control.  You lose your balance or have trouble walking.  You feel like you will pass out (faint) or you pass out.  You have confusion.   This information is not intended to replace advice given to you by your health care provider. Make sure you discuss any questions you have with your health care provider.   Document Released: 08/23/2008 Document Revised: 08/05/2015 Document Reviewed: 03/09/2015 Elsevier Interactive Patient Education 2016 [employer]  Migraine Headache A migraine headache is an intense, throbbing pain on one or both sides of your head. A migraine can last for 30 minutes to several hours. CAUSES  The exact cause of a migraine headache is not always known. However, a migraine may be caused when nerves in the brain become irritated and release chemicals that cause inflammation. This causes pain. Certain things may also trigger migraines, such as:  Alcohol.  Smoking.  Stress.  Menstruation.  Aged cheeses.  Foods or drinks that contain nitrates, glutamate, aspartame, or tyramine.  Lack of sleep.  Chocolate.  Caffeine.  Hunger.  Physical exertion.  Fatigue.  Medicines used to treat chest pain (nitroglycerine), birth control pills, estrogen, and some blood pressure medicines. SIGNS AND SYMPTOMS  Pain on one or both sides of your head.  Pulsating or throbbing pain.  Severe pain that prevents daily activities.  Pain that is aggravated by any physical activity.  Nausea, vomiting, or  both.  Dizziness.  Pain with exposure to bright lights, loud noises, or activity.  General sensitivity to bright lights, loud noises, or smells. Before you get a migraine, you may get warning signs that a migraine is coming (aura). An aura may include:  Seeing flashing lights.  Seeing bright spots, halos, or zigzag lines.  Having tunnel vision or blurred vision.  Having feelings of numbness or tingling.  Having trouble talking.  Having muscle weakness. DIAGNOSIS  A migraine headache is often diagnosed based on:  Symptoms.  Physical exam.  A CT scan or MRI of your head. These imaging tests cannot diagnose migraines, but they can help rule out other causes of headaches. TREATMENT Medicines may be given for pain and nausea. Medicines can also be given to help prevent recurrent migraines.  HOME CARE INSTRUCTIONS  Only take over-the-counter or prescription medicines for pain or discomfort as directed by your health care provider. The use of long-term narcotics is not recommended.  Lie down in a dark, quiet room when you have a migraine.  Keep a journal to find out what may trigger your migraine headaches. For example, write down:  What you eat and drink.  How much sleep you get.  Any change to your diet or medicines.  Limit alcohol consumption.  Quit smoking if you smoke.  Get 7-9 hours of sleep, or as recommended by your health care provider.  Limit stress.  Keep lights dim if bright lights bother you and make your migraines worse. SEEK IMMEDIATE MEDICAL CARE IF:   Your migraine becomes severe.  You have a fever.  You have a stiff neck.  You have vision loss.  You have muscular weakness or loss of muscle control.  You start losing your balance or have trouble walking.  You feel faint or pass out.  You have severe symptoms that are different from your first symptoms. MAKE SURE YOU:   Understand these instructions.  Will watch your  condition.  Will get help right away if you are not doing well or get worse.   This information is not intended to replace advice given to you by your health care provider. Make sure you discuss any questions you have with your health care provider.   Document Released: 11/14/2005 Document Revised: 12/05/2014 Document Reviewed: 07/22/2013 Elsevier Interactive Patient Education [employer]

## 2016-09-16 ENCOUNTER — Other Ambulatory Visit: Payer: Self-pay | Admitting: Occupational Medicine

## 2016-09-16 ENCOUNTER — Ambulatory Visit
Admission: RE | Admit: 2016-09-16 | Discharge: 2016-09-16 | Disposition: A | Discharge status: Home / Self Care | Payer: No Typology Code available for payment source | Source: Ambulatory Visit | Attending: Occupational Medicine | Admitting: Occupational Medicine

## 2016-09-16 DIAGNOSIS — Z021 Encounter for pre-employment examination: Secondary | ICD-10-CM

## 2018-03-12 IMAGING — CR DG CHEST 1V
1 series · 1 of 1 positions shown · non-contrast
Comparison: 06/15/2009

CLINICAL DATA: Pre-employment chest radiograph.

EXAM:
CHEST 1 VIEW

[w chest pa]
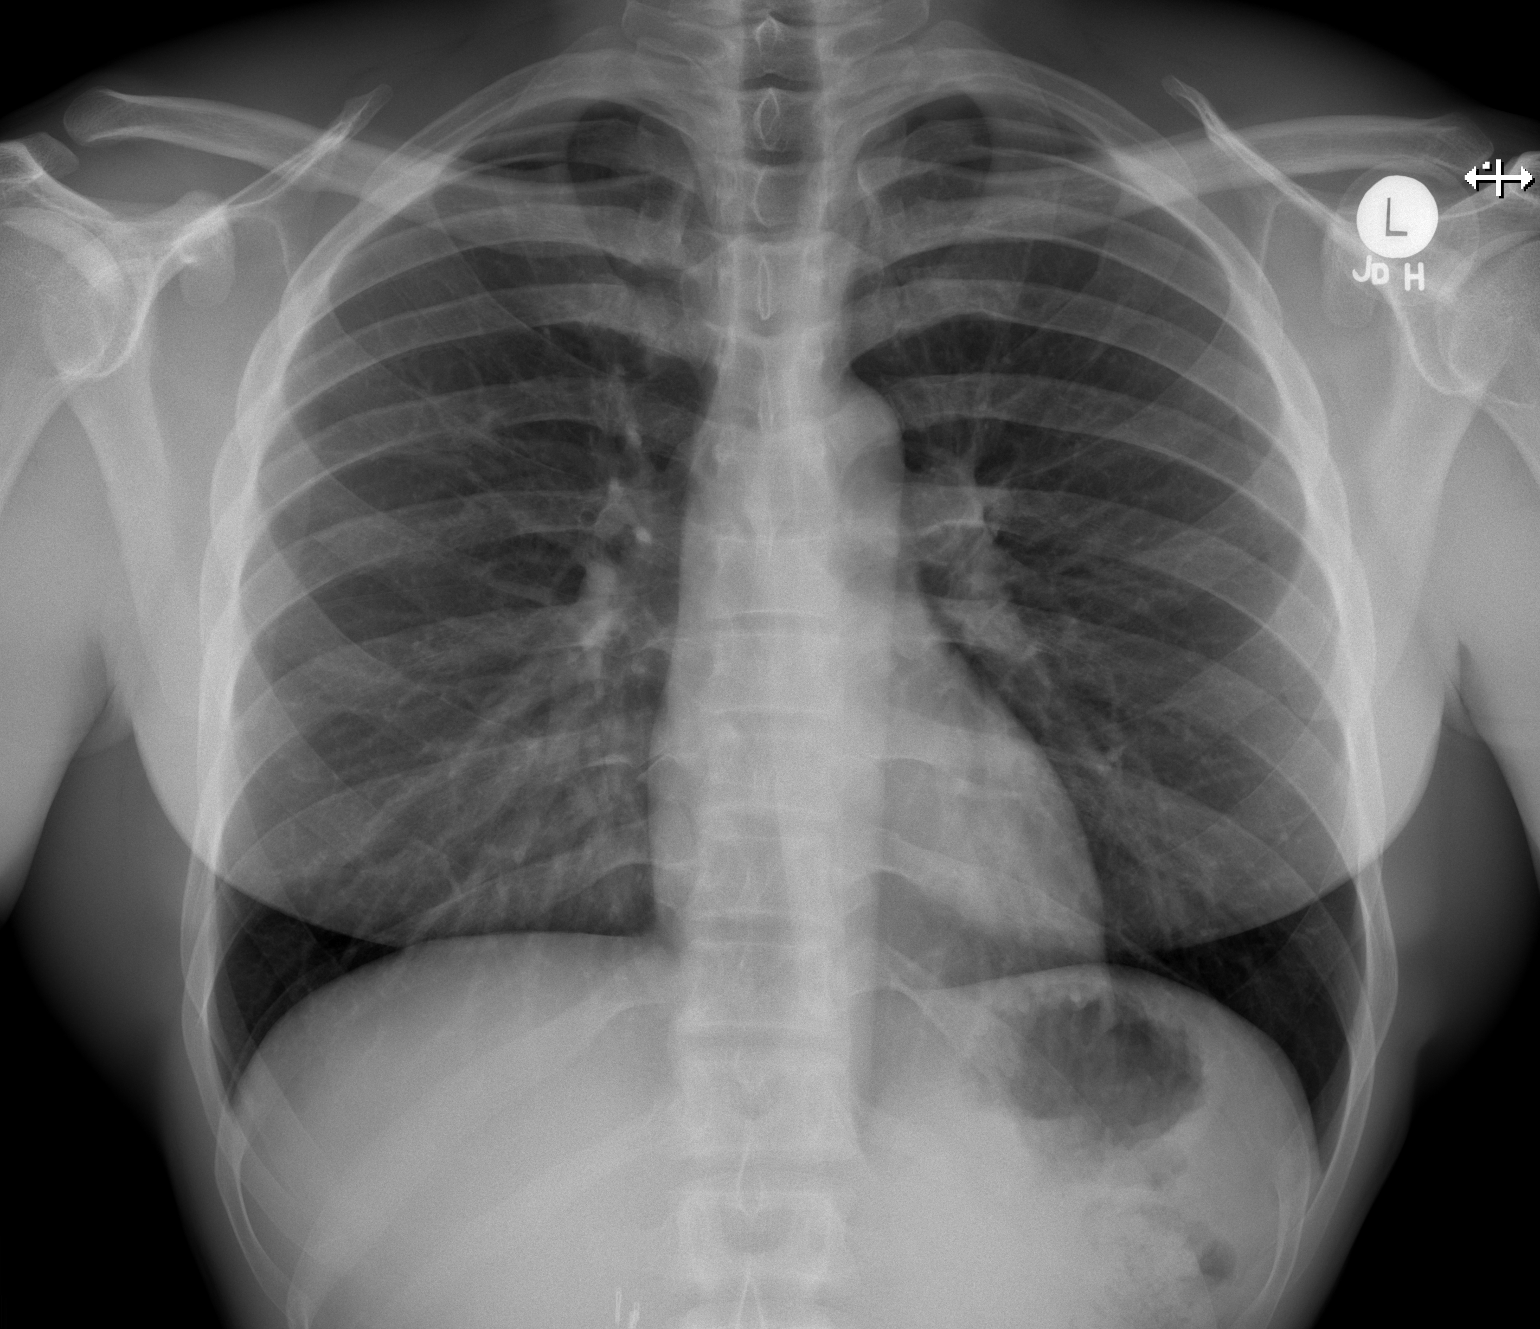

[1 of 1 positions shown; findings below may reference images not displayed]

FINDINGS: The cardiomediastinal silhouette is unremarkable.

There is no evidence of focal airspace disease, pulmonary edema,
suspicious pulmonary nodule/mass, pleural effusion, or pneumothorax.

No acute bony abnormalities are identified.
IMPRESSION: No active disease.

## 2019-12-25 ENCOUNTER — Ambulatory Visit: Payer: 59 | Attending: Internal Medicine

## 2019-12-25 DIAGNOSIS — Z20822 Contact with and (suspected) exposure to covid-19: Secondary | ICD-10-CM

## 2019-12-26 LAB — NOVEL CORONAVIRUS, NAA: SARS-CoV-2, NAA: NOT DETECTED

## 2022-05-03 ENCOUNTER — Emergency Department: Admit: 2022-05-03 | Primary: Family Medicine

## 2022-05-03 ENCOUNTER — Inpatient Hospital Stay
Admit: 2022-05-03 | Discharge: 2022-05-03 | Disposition: A | Discharge status: Home / Self Care | Attending: Emergency Medicine

## 2022-05-03 DIAGNOSIS — M79641 Pain in right hand: Secondary | ICD-10-CM

## 2022-05-03 NOTE — ED Notes (Signed)
I have reviewed discharge instructions with the patient.  The patient verbalized understanding.    Patient left ED via Discharge Method: ambulatory to Home with family    Opportunity for questions and clarification provided.       Patient given 0 scripts.         To continue your aftercare when you leave the hospital, you may receive an automated call from our care team to check in on how you are doing.  This is a free service and part of our promise to provide the best care and service to meet your aftercare needs." If you have questions, or wish to unsubscribe from this service please call [phone].  Thank you for Choosing our [facility] Emergency Department.        Algie Coffer, RN  05/03/22 [phone]

## 2022-05-03 NOTE — Discharge Instructions (Signed)
Take motrin

## 2022-05-03 NOTE — ED Notes (Signed)
Patient acknowledges that his BP is high. Patient states he will follow up with his PCP to address his HTN. Pt asymptomatic for HTN.     Luvenia Heller, RN  05/03/22 [phone]

## 2022-05-03 NOTE — ED Provider Notes (Signed)
Emergency Department Provider Note       PCP: Orlena Sheldon, MD   Age: 41 y.o.   Sex: male     DISPOSITION Decision To Discharge 05/03/2022 09:37:48 AM       ICD-10-CM    1. Hand pain, right  M79.641           Medical Decision Making     Complexity of Problems Addressed:  Acute illness    Data Reviewed and Analyzed:  Category 1:   I independently ordered and reviewed each unique test.         Category 2:     I interpreted the X-rays no acute.    Category 3: Discussion of management or test interpretation.  Patient states he pops his knuckles.  For the past week or so has had difficulty in the right hand popping his right third MCP joint.  He has some mild swelling there.  Minimal pain.  No limited range of motion.  With continued symptoms came here for evaluation.  There is no acute on x-ray.  Will discharge with Motrin and rest and outpatient follow-up.      Risk of Complications and/or Morbidity of Patient Management:  Patient was discharged risks and benefits of hospitalization were considered.  Shared medical decision making was utilized in creating the patients health plan today.         Is this patient to be included in the SEP-1 core measure due to severe sepsis or septic shock? No Exclusion criteria - the patient is NOT to be included for SEP-1 Core Measure due to: Infection is not suspected      History      Jared Mcmahon is a 41 y.o. male who presents to the Emergency Department with chief complaint of    Chief Complaint   Patient presents with    Hand Pain     right      Patient with no previous injury to his hands.  Developed some swelling in his right hand over the third MCP joint.  States it feels weird like it needs to be popped but cannot.  Denies any finger swelling, redness.  No previous similar episodes.  Comes in for evaluation.    The history is provided by the patient. No language interpreter was used.   Hand Pain  This is a new problem. The current episode started more than 2 days ago. The  problem occurs constantly. The problem has been gradually worsening. Pertinent negatives include no chest pain, no abdominal pain, no headaches and no shortness of breath. The symptoms are aggravated by bending. Nothing relieves the symptoms. He has tried nothing for the symptoms.      Review of Systems   Constitutional:  Negative for chills and fever.   Respiratory:  Negative for cough and shortness of breath.    Cardiovascular:  Negative for chest pain and palpitations.   Gastrointestinal:  Negative for abdominal pain, constipation, diarrhea, nausea and vomiting.   Musculoskeletal:  Positive for arthralgias and joint swelling. Negative for back pain and neck pain.   Skin:  Negative for color change and rash.   Neurological:  Negative for numbness and headaches.   All other systems reviewed and are negative.    Physical Exam     Vitals signs and nursing note reviewed:  Vitals:    05/03/22 0820 05/03/22 0825   BP: (!) 150/102 (!) 144/102   Pulse: 86    Resp: 18  Temp: 98.1 F (36.7 C)    TempSrc: Oral    SpO2: 97%    Weight: 175 lb (79.4 kg)    Height: 5\' 7"  (1.702 m)       Physical Exam  Vitals and nursing note reviewed.   Constitutional:       Appearance: Normal appearance.   HENT:      Head: Normocephalic and atraumatic.   Cardiovascular:      Rate and Rhythm: Normal rate and regular rhythm.   Pulmonary:      Effort: Pulmonary effort is normal.      Breath sounds: Normal breath sounds. No wheezing.   Musculoskeletal:         General: Swelling and tenderness (mild TTP over 3rd MCP joint right hand. no finger swelling and FROM>) present. Normal range of motion.   Skin:     General: Skin is warm and dry.      Findings: No erythema.   Neurological:      Mental Status: He is alert.        Procedures     Procedures    Orders Placed This Encounter   Procedures    XR HAND RIGHT (MIN 3 VIEWS)        Medications - No data to display    New Prescriptions    No medications on file        Past Medical History:   Diagnosis  Date    Anxiety     HTN (hypertension)         History reviewed. No pertinent surgical history.     Social History     Socioeconomic History    Marital status: Single     Spouse name: None    Number of children: None    Years of education: None    Highest education level: None   Tobacco Use    Smoking status: Never    Smokeless tobacco: Never   Vaping Use    Vaping Use: Never used   Substance and Sexual Activity    Drug use: Yes     Types: Marijuana (Weed)        Previous Medications    LISINOPRIL (PRINIVIL;ZESTRIL) 2.5 MG TABLET    Take 1 tablet by mouth daily    LORAZEPAM (ATIVAN) 1 MG TABLET    Take 1 tablet by mouth every 6 hours as needed for Anxiety. Max Daily Amount: 4 mg        Results for orders placed or performed during the hospital encounter of 05/03/22   XR HAND RIGHT (MIN 3 VIEWS)    Narrative    Exam: XR HAND RIGHT (MIN 3 VIEWS) on 05/03/2022 9:12 AM    Clinical History: The Male patient is 41 years old  presenting for right hand  pain and swelling.    Comparison:  none    Findings:    3 views of the right hand were obtained.     No fracture or dislocation is identified.    Joint spaces are well-maintained.      Impression    1. No acute osseous or joint abnormalities.                XR HAND RIGHT (MIN 3 VIEWS)   Final Result      1. No acute osseous or joint abnormalities.  Voice dictation software was used during the making of this note.  This software is not perfect and grammatical and other typographical errors may be present.  This note has not been completely proofread for errors.     Towanda Octave III, MD  05/03/22 [phone]

## 2022-05-03 NOTE — ED Triage Notes (Signed)
Ambulatory to triage. C/o right hand pain, swelling. Denies injury/trauma

## 2024-03-31 ENCOUNTER — Inpatient Hospital Stay
Admit: 2024-03-31 | Discharge: 2024-03-31 | Disposition: A | Discharge status: Home / Self Care | Attending: Emergency Medicine

## 2024-03-31 ENCOUNTER — Emergency Department: Admit: 2024-03-31 | Primary: Family Medicine

## 2024-03-31 DIAGNOSIS — M5441 Lumbago with sciatica, right side: Secondary | ICD-10-CM

## 2024-03-31 DIAGNOSIS — R310 Gross hematuria: Secondary | ICD-10-CM

## 2024-03-31 LAB — CBC WITH AUTO DIFFERENTIAL
Basophils %: 0.7 % (ref 0.0–2.0)
Basophils Absolute: 0.05 10*3/uL (ref 0.00–0.20)
Eosinophils %: 2.1 % (ref 0.5–7.8)
Eosinophils Absolute: 0.16 10*3/uL (ref 0.00–0.80)
Hematocrit: 42.7 % (ref 41.1–50.3)
Hemoglobin: 14.7 g/dL (ref 13.6–17.2)
Immature Granulocytes %: 0.3 % (ref 0.0–5.0)
Immature Granulocytes Absolute: 0.02 10*3/uL (ref 0.0–0.5)
Lymphocytes %: 24.8 % (ref 13.0–44.0)
Lymphocytes Absolute: 1.88 10*3/uL (ref 0.50–4.60)
MCH: 28.1 pg (ref 26.1–32.9)
MCHC: 34.4 g/dL (ref 31.4–35.0)
MCV: 81.5 FL — ABNORMAL LOW (ref 82.0–102.0)
MPV: 10.6 FL (ref 9.4–12.3)
Monocytes %: 8.4 % (ref 4.0–12.0)
Monocytes Absolute: 0.64 10*3/uL (ref 0.10–1.30)
Neutrophils %: 63.7 % (ref 43.0–78.0)
Neutrophils Absolute: 4.83 10*3/uL (ref 1.70–8.20)
Platelets: 238 10*3/uL (ref 150–450)
RBC: 5.24 M/uL (ref 4.23–5.60)
RDW: 13.2 % (ref 11.9–14.6)
WBC: 7.6 10*3/uL (ref 4.3–11.1)
nRBC: 0 10*3/uL (ref 0.0–0.2)

## 2024-03-31 LAB — BASIC METABOLIC PANEL
Anion Gap: 11 mmol/L (ref 7–16)
BUN: 17 mg/dL (ref 6–23)
CO2: 26 mmol/L (ref 20–29)
Calcium: 9.5 mg/dL (ref 8.8–10.2)
Chloride: 106 mmol/L (ref 98–107)
Creatinine: 1.01 mg/dL (ref 0.80–1.30)
Est, Glom Filt Rate: >90 mL/min/{1.73_m2} (ref >60)
Glucose: 98 mg/dL (ref 65–100)
Potassium: 4.1 mmol/L (ref 3.5–5.1)
Sodium: 143 mmol/L (ref 133–143)

## 2024-03-31 LAB — HEPATIC FUNCTION PANEL
ALT: 23 U/L (ref 12–65)
AST: 32 U/L (ref 15–37)
Albumin/Globulin Ratio: 1.4 (ref 1.0–1.9)
Albumin: 4.5 g/dL (ref 3.5–5.0)
Alk Phosphatase: 77 U/L (ref 40–129)
Bilirubin, Direct: 0.2 mg/dL (ref 0.0–0.3)
Globulin: 3.3 g/dL (ref 2.3–3.5)
Total Bilirubin: 0.7 mg/dL (ref 0.0–1.2)
Total Protein: 7.8 g/dL (ref 6.3–8.2)

## 2024-03-31 LAB — PROTIME-INR
INR: 1.1
Protime: 14 s (ref 11.3–14.9)

## 2024-03-31 LAB — URINALYSIS
Bilirubin, Urine: NEGATIVE
Glucose, Ur: NEGATIVE mg/dL
Ketones, Urine: NEGATIVE mg/dL
Leukocyte Esterase, Urine: NEGATIVE
Nitrite, Urine: NEGATIVE
Protein, UA: 100 mg/dL — AB
Specific Gravity, UA: 1.015 (ref 1.001–1.023)
Urobilinogen, Urine: 0.2 EU/dL (ref 0.2–1.0)
pH, Urine: 5.5 (ref 5.0–9.0)

## 2024-03-31 LAB — URINALYSIS, MICRO
BACTERIA, URINE: 0 /HPF
Epithelial Cells, UA: 0 /HPF
Mucus, UA: 0 /LPF
RBC, UA: >100 /HPF

## 2024-03-31 MED ORDER — PREDNISONE 10 MG PO TABS
10 | ORAL | Status: AC
Start: 2024-03-31 — End: 2024-03-31
  Administered 2024-03-31: 15:00:00 60 mg via ORAL

## 2024-03-31 MED ORDER — CYCLOBENZAPRINE HCL 10 MG PO TABS
10 | ORAL | Status: AC
Start: 2024-03-31 — End: 2024-03-31
  Administered 2024-03-31: 15:00:00 10 mg via ORAL

## 2024-03-31 MED ORDER — METHOCARBAMOL 750 MG PO TABS
750 | ORAL_TABLET | Freq: Three times a day (TID) | ORAL | 0 refills | 10.00000 days | Status: AC | PRN
Start: 2024-03-31 — End: 2024-04-10

## 2024-03-31 MED ORDER — IBUPROFEN 800 MG PO TABS
800 | ORAL_TABLET | Freq: Three times a day (TID) | ORAL | 0 refills | 10.00000 days | Status: AC | PRN
Start: 2024-03-31 — End: ?

## 2024-03-31 MED ORDER — HYDROCODONE-ACETAMINOPHEN 10-325 MG PO TABS
10-325 | ORAL_TABLET | Freq: Three times a day (TID) | ORAL | 0 refills | Status: AC | PRN
Start: 2024-03-31 — End: 2024-04-03

## 2024-03-31 MED ORDER — PREDNISONE 20 MG PO TABS
20 | ORAL_TABLET | Freq: Every day | ORAL | 0 refills | 8.00000 days | Status: AC
Start: 2024-03-31 — End: 2024-04-05

## 2024-03-31 MED ORDER — IBUPROFEN 800 MG PO TABS
800 | ORAL | Status: AC
Start: 2024-03-31 — End: 2024-03-31
  Administered 2024-03-31: 15:00:00 800 mg via ORAL

## 2024-03-31 MED ORDER — HYDROCODONE-ACETAMINOPHEN 10-325 MG PO TABS
10-325 | ORAL | Status: AC
Start: 2024-03-31 — End: 2024-03-31
  Administered 2024-03-31: 15:00:00 1 via ORAL

## 2024-03-31 MED FILL — CYCLOBENZAPRINE HCL 10 MG PO TABS: 10 MG | ORAL | Qty: 1 | Fill #0

## 2024-03-31 MED FILL — IBUPROFEN 800 MG PO TABS: 800 MG | ORAL | Qty: 1 | Fill #0

## 2024-03-31 MED FILL — PREDNISONE 10 MG PO TABS: 10 MG | ORAL | Qty: 1 | Fill #0

## 2024-03-31 MED FILL — HYDROCODONE-ACETAMINOPHEN 10-325 MG PO TABS: 10-325 MG | ORAL | Qty: 1 | Fill #0
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
## 2024-03-31 NOTE — ED Notes (Signed)
 Patient mobility status  with no difficulty.     I have reviewed discharge instructions with the patient.  The patient verbalized understanding.    Patient left ED via Discharge Method: ambulatory to Home with Significant Other.    Opportunity for questions and clarification provided.     Patient given 3 scripts.

## 2024-03-31 NOTE — ED Triage Notes (Signed)
 Pt states that he has had several episodes of hematuria this morning.  Denies any painful or difficulty with urination.  Approx 3 weeks ago had a sudden, sharp pain in the right flank area that radiated into his right leg but the pain subsided.  Not taking his BP meds

## 2024-03-31 NOTE — Discharge Instructions (Addendum)
 Use cold packs 5 to 10 minutes, every hour to every-other-hour, for first 48 hours,  Then switch to a heating pad, 5 to 10 minutes, every hour to every-other-hour, for a few days,  Do not go to heat, right away    Medicines as directed for the back pain/sciatica  Follow-up with family doctor if not improving you may need an MRI of the low back    Follow-up with urology regarding the blood in the urine  Drink plenty of fluids,  Empty your bladder a bit more frequently than you would ordinarily  Return if you feel left out or unable to pee

## 2024-04-25 ENCOUNTER — Encounter: Attending: Urology | Primary: Family Medicine
